# Patient Record
Sex: Male | Born: 1995 | Race: White | Hispanic: No | Marital: Single | Smoking: Never smoker
Health system: Southern US, Community
[De-identification: ages and names within clinical notes are randomized; demographics above are authoritative.]

## PROBLEM LIST (undated history)

## (undated) HISTORY — PX: TONSILECTOMY, ADENOIDECTOMY, BILATERAL MYRINGOTOMY AND TUBES: SHX2538

---
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
## 2014-02-04 ENCOUNTER — Ambulatory Visit (INDEPENDENT_AMBULATORY_CARE_PROVIDER_SITE_OTHER): Payer: BC Managed Care – PPO | Admitting: Family Medicine

## 2014-02-04 VITALS — BP 120/68 | HR 81 | Temp 98.2°F | Resp 16 | Ht 73.5 in | Wt 218.0 lb

## 2014-02-04 DIAGNOSIS — M791 Myalgia, unspecified site: Secondary | ICD-10-CM

## 2014-02-04 DIAGNOSIS — J029 Acute pharyngitis, unspecified: Secondary | ICD-10-CM

## 2014-02-04 DIAGNOSIS — IMO0001 Reserved for inherently not codable concepts without codable children: Secondary | ICD-10-CM

## 2014-02-04 DIAGNOSIS — D72829 Elevated white blood cell count, unspecified: Secondary | ICD-10-CM

## 2014-02-04 DIAGNOSIS — R509 Fever, unspecified: Secondary | ICD-10-CM

## 2014-02-04 LAB — POCT CBC
GRANULOCYTE PERCENT: 82 % — AB (ref 37–80)
HEMATOCRIT: 46.1 % (ref 43.5–53.7)
HEMOGLOBIN: 15.4 g/dL (ref 14.1–18.1)
LYMPH, POC: 1.3 (ref 0.6–3.4)
MCH, POC: 29.6 pg (ref 27–31.2)
MCHC: 33.4 g/dL (ref 31.8–35.4)
MCV: 88.5 fL (ref 80–97)
MID (cbc): 0.7 (ref 0–0.9)
MPV: 12.7 fL (ref 0–99.8)
PLATELET COUNT, POC: 148 10*3/uL (ref 142–424)
POC GRANULOCYTE: 8.9 — AB (ref 2–6.9)
POC LYMPH %: 11.7 % (ref 10–50)
POC MID %: 6.3 %M (ref 0–12)
RBC: 5.21 M/uL (ref 4.69–6.13)
RDW, POC: 14.1 %
WBC: 10.9 10*3/uL — AB (ref 4.6–10.2)

## 2014-02-04 LAB — POCT RAPID STREP A (OFFICE): RAPID STREP A SCREEN: NEGATIVE

## 2014-02-04 LAB — POCT INFLUENZA A/B
INFLUENZA B, POC: NEGATIVE
Influenza A, POC: NEGATIVE

## 2014-02-04 MED ORDER — AMOXICILLIN 875 MG PO TABS: 875.0000 mg | ORAL_TABLET | Freq: Two times a day (BID) | ORAL | Status: DC

## 2014-02-04 NOTE — Progress Notes (Signed)
Subjective:    Patient ID: Richard Forbes, male    DOB: October 30, 1996, 18 y.o.   MRN: [id number] No primary provider on file. This chart was scribed for Meredith StaggersJeffrey Kinnley Paulson, MD by Marica OtterNusrat Rahman, ED Scribe at [facility]. This patient was seen in room Room 11 and the patient's care was started at 10:13 AM.  Sore Throat  Associated symptoms include headaches (mild headaches ). Pertinent negatives include no coughing.   HPI Comments: Richard Forbes is a 18 y.o. male who presents to the [facility] complaining of intermittent fever of 102.7 degrees onset yesterday morning.  Pt complains of associated body aches and sore throat. Pt states that he took an Advil without relief. Pt denies coughing; and reports infrequent, mild headache. Pt did not check his temperature today. Pt further reports that [facility] [employer] [facility] for nasal and chest congestion and cough  Pt reports he was prescribed antibiotics by the infirmary - took for about a week, finished about 6 days ago- finished early,  when he began to feel better. Pt denies having a flu shot this year. Pt states that he had problems with asthma in the past but denies any issues with it "in a very long time." no abd pain, no dysuria.   Has been having some back pain - off and on for a few months. Middle of lower back.  No urinary sx's.   Review of Systems  Constitutional: Positive for fever.  HENT: Positive for sore throat.   Eyes: Negative for photophobia.  Respiratory: Negative for cough.        Chest congestion   Genitourinary: Negative for dysuria, urgency, frequency, hematuria, decreased urine volume and difficulty urinating.  Musculoskeletal: Positive for back pain.  Skin: Negative for rash.  Neurological: Positive for headaches (mild headaches ).       Objective:   Physical Exam  Nursing note and vitals reviewed. Constitutional: He is oriented to person, place, and time.  He appears well-developed and well-nourished. No distress.  HENT:  Head: Normocephalic and atraumatic.  No neck pain. Mild, infrequent headaches.  Scarring of the leftTM, no retraction and no fluid. Scarring of the right TM, no scarring, no retraction.  Minimal erythema and no exudate on the oropharynx.     Eyes: EOM are normal.  No photophobia   Neck: Neck supple. No tracheal deviation present.  Cardiovascular: Normal rate.   Pulmonary/Chest: Effort normal. No respiratory distress.  Abdominal: Normal appearance and bowel sounds are normal. There is no hepatosplenomegaly. There is no tenderness. There is no CVA tenderness.  Musculoskeletal: Normal range of motion.  Neurological: He is alert and oriented to person, place, and time.  Skin: Skin is warm and dry.  Psychiatric: He has a normal mood and affect. His behavior is normal.   Filed Vitals:   02/04/14 0919  BP: 120/68  Pulse: 81  Temp: 98.2 F (36.8 C)  TempSrc: Oral  Resp: 16  Height: 6' 1.5" (1.867 m)  Weight: 218 lb (98.884 kg)  SpO2: 98%     Results for orders placed in visit on 02/04/14  POCT INFLUENZA A/B      Result Value Ref Range   Influenza A, POC Negative     Influenza B, POC Negative    POCT RAPID STREP A (OFFICE)      Result Value Ref Range   Rapid Strep A Screen Negative  Negative  POCT  CBC      Result Value Ref Range   WBC 10.9 (*) 4.6 - 10.2 K/uL   Lymph, poc 1.3  0.6 - 3.4   POC LYMPH PERCENT 11.7  10 - 50 %L   MID (cbc) 0.7  0 - 0.9   POC MID % 6.3  0 - 12 %M   POC Granulocyte 8.9 (*) 2 - 6.9   Granulocyte percent 82.0 (*) 37 - 80 %G   RBC 5.21  4.69 - 6.13 M/uL   Hemoglobin 15.4  14.1 - 18.1 g/dL   HCT, POC 16.1  09.6 - 53.7 %   MCV 88.5  80 - 97 fL   MCH, POC 29.6  27 - 31.2 pg   MCHC 33.4  31.8 - 35.4 g/dL   RDW, POC 04.5     Platelet Count, POC 148  142 - 424 K/uL   MPV 12.7  0 - 99.8 fL     Assessment & Plan:   Richard Forbes is a 18 y.o. male Sore throat - Plan: POCT  Influenza A/B, POCT rapid strep A, Culture, Group A Strep, POCT CBC, Epstein-Barr virus VCA, IgM, amoxicillin (AMOXIL) 875 MG tablet  Fever - Plan: POCT Influenza A/B, POCT rapid strep A, POCT CBC  Myalgia - Plan: POCT Influenza A/B, POCT rapid strep A  Leukocytosis, unspecified - Plan: amoxicillin (AMOXIL) 875 MG tablet     Borderline CBC with fever last 2 nights. Sore throat. Throat cx sent. Will check EBV IGM now, but may need repeated in 2 weeks if negative. Contact sport precautions until then as spleen precautions discussed. Possible viral illness, with borderline CBC, but with granulocyte predominance - possible false negative strep. Start amoxicillin twice per day. Sx care, rtc precautions.     Meds ordered this encounter  Medications  . Ibuprofen (ADVIL) 200 MG CAPS    Sig: Take by mouth.  Marland Kitchen acetaminophen (TYLENOL) 500 MG tablet    Sig: Take 500 mg by mouth every 6 (six) hours as needed.  Marland Kitchen amoxicillin (AMOXIL) 875 MG tablet    Sig: Take 1 tablet (875 mg total) by mouth 2 (two) times daily.    Dispense:  20 tablet    Refill:  0   Patient Instructions  You should receive a call or letter about your lab results within the next week to 10 days.  Start amoxicillin for possible strep throat, but avoid contact sports until mono testing returns - this may need to be rechecked in next week or two.  Ibuprofen if needed for fever/sore throat.  Return to the clinic or go to the nearest emergency room if any of your symptoms worsen or new symptoms occur.   Sore Throat A sore throat is pain, burning, irritation, or scratchiness of the throat. There is often pain or tenderness when swallowing or talking. A sore throat may be accompanied by other symptoms, such as coughing, sneezing, fever, and swollen neck glands. A sore throat is often the first sign of another sickness, such as a cold, flu, strep throat, or mononucleosis (commonly known as mono). Most sore throats go away without  medical treatment. CAUSES  The most common causes of a sore throat include:  A viral infection, such as a cold, flu, or mono.  A bacterial infection, such as strep throat, tonsillitis, or whooping cough.  Seasonal allergies.  Dryness in the air.  Irritants, such as smoke or pollution.  Gastroesophageal reflux disease (GERD). HOME CARE INSTRUCTIONS   Only  take over-the-counter medicines as directed by your caregiver.  Drink enough fluids to keep your urine clear or pale yellow.  Rest as needed.  Try using throat sprays, lozenges, or sucking on hard candy to ease any pain (if older than 4 years or as directed).  Sip warm liquids, such as broth, herbal tea, or warm water with honey to relieve pain temporarily. You may also eat or drink cold or frozen liquids such as frozen ice pops.  Gargle with salt water (mix 1 tsp salt with 8 oz of water).  Do not smoke and avoid secondhand smoke.  Put a cool-mist humidifier in your bedroom at night to moisten the air. You can also turn on a hot shower and sit in the bathroom with the door closed for 5 10 minutes. SEEK IMMEDIATE MEDICAL CARE IF:  You have difficulty breathing.  You are unable to swallow fluids, soft foods, or your saliva.  You have increased swelling in the throat.  Your sore throat does not get better in 7 days.  You have nausea and vomiting.  You have a fever or persistent symptoms for more than 2 3 days.  You have a fever and your symptoms suddenly get worse. MAKE SURE YOU:   Understand these instructions.  Will watch your condition.  Will get help right away if you are not doing well or get worse. Document Released: 12/24/2004 Document Revised: 11/02/2012 Document Reviewed: 07/24/2012 [facility] Patient Information 2014 [place], [place].        I personally performed the services described in this documentation, which was scribed in my presence. The recorded information has been reviewed and considered,  and addended by me as needed.

## 2014-02-04 NOTE — Patient Instructions (Signed)
You should receive a call or letter about your lab results within the next week to 10 days.  Start amoxicillin for possible strep throat, but avoid contact sports until mono testing returns - this may need to be rechecked in next week or two.  Ibuprofen if needed for fever/sore throat.  Return to the clinic or go to the nearest emergency room if any of your symptoms worsen or new symptoms occur.   Sore Throat A sore throat is pain, burning, irritation, or scratchiness of the throat. There is often pain or tenderness when swallowing or talking. A sore throat may be accompanied by other symptoms, such as coughing, sneezing, fever, and swollen neck glands. A sore throat is often the first sign of another sickness, such as a cold, flu, strep throat, or mononucleosis (commonly known as mono). Most sore throats go away without medical treatment. CAUSES  The most common causes of a sore throat include:  A viral infection, such as a cold, flu, or mono.  A bacterial infection, such as strep throat, tonsillitis, or whooping cough.  Seasonal allergies.  Dryness in the air.  Irritants, such as smoke or pollution.  Gastroesophageal reflux disease (GERD). HOME CARE INSTRUCTIONS   Only take over-the-counter medicines as directed by your caregiver.  Drink enough fluids to keep your urine clear or pale yellow.  Rest as needed.  Try using throat sprays, lozenges, or sucking on hard candy to ease any pain (if older than 4 years or as directed).  Sip warm liquids, such as broth, herbal tea, or warm water with honey to relieve pain temporarily. You may also eat or drink cold or frozen liquids such as frozen ice pops.  Gargle with salt water (mix 1 tsp salt with 8 oz of water).  Do not smoke and avoid secondhand smoke.  Put a cool-mist humidifier in your bedroom at night to moisten the air. You can also turn on a hot shower and sit in the bathroom with the door closed for 5 10 minutes. SEEK  IMMEDIATE MEDICAL CARE IF:  You have difficulty breathing.  You are unable to swallow fluids, soft foods, or your saliva.  You have increased swelling in the throat.  Your sore throat does not get better in 7 days.  You have nausea and vomiting.  You have a fever or persistent symptoms for more than 2 3 days.  You have a fever and your symptoms suddenly get worse. MAKE SURE YOU:   Understand these instructions.  Will watch your condition.  Will get help right away if you are not doing well or get worse. Document Released: 12/24/2004 Document Revised: 11/02/2012 Document Reviewed: 07/24/2012 [facility] Patient Information 2014 [place], [place].

## 2014-02-06 LAB — EPSTEIN-BARR VIRUS VCA, IGM: EBV VCA IgM: <10 U/mL (ref <36.0)

## 2014-02-07 LAB — CULTURE, GROUP A STREP

## 2014-05-16 ENCOUNTER — Ambulatory Visit (INDEPENDENT_AMBULATORY_CARE_PROVIDER_SITE_OTHER): Payer: BC Managed Care – PPO | Admitting: Family Medicine

## 2014-05-16 VITALS — BP 102/64 | HR 74 | Temp 98.3°F | Resp 18 | Ht 73.5 in | Wt 214.0 lb

## 2014-05-16 DIAGNOSIS — H66019 Acute suppurative otitis media with spontaneous rupture of ear drum, unspecified ear: Secondary | ICD-10-CM

## 2014-05-16 MED ORDER — OFLOXACIN 0.3 % OT SOLN
10.0000 [drp] | Freq: Every day | OTIC | Status: AC
Start: 2014-05-16 — End: ?

## 2014-05-16 NOTE — Patient Instructions (Signed)
Use drops as prescribed for 7 days Follow up with ENT Keep water out of ear- can use a cotton ball  Otitis Media Otitis media is redness, soreness, and swelling (inflammation) of the middle ear. Otitis media may be caused by allergies or, most commonly, by infection. Often it occurs as a complication of the common cold. SIGNS AND SYMPTOMS Symptoms of otitis media may include:  Earache.  Fever.  Ringing in your ear.  Headache.  Leakage of fluid from the ear. DIAGNOSIS To diagnose otitis media, your health care provider will examine your ear with an otoscope. This is an instrument that allows your health care provider to see into your ear in order to examine your eardrum. Your health care provider also will ask you questions about your symptoms. TREATMENT  Typically, otitis media resolves on its own within 3-5 days. Your health care provider may prescribe medicine to ease your symptoms of pain. If otitis media does not resolve within 5 days or is recurrent, your health care provider may prescribe antibiotic medicines if he or she suspects that a bacterial infection is the cause. HOME CARE INSTRUCTIONS   Take your medicine as directed until it is gone, even if you feel better after the first few days.  Only take over-the-counter or prescription medicines for pain, discomfort, or fever as directed by your health care provider.  Follow up with your health care provider as directed. SEEK MEDICAL CARE IF:  You have otitis media only in one ear, or bleeding from your nose, or both.  You notice a lump on your neck.  You are not getting better in 3-5 days.  You feel worse instead of better. SEEK IMMEDIATE MEDICAL CARE IF:   You have pain that is not controlled with medicine.  You have swelling, redness, or pain around your ear or stiffness in your neck.  You notice that part of your face is paralyzed.  You notice that the bone behind your ear (mastoid) is tender when you touch  it. MAKE SURE YOU:   Understand these instructions.  Will watch your condition.  Will get help right away if you are not doing well or get worse. Document Released: 08/21/2004 Document Revised: 11/21/2013 Document Reviewed: 06/13/2013 [facility] Patient Information 2015 [place], [place]. This information is not intended to replace advice given to you by your health care provider. Make sure you discuss any questions you have with your health care provider.

## 2014-05-16 NOTE — Progress Notes (Signed)
   Subjective:    Patient ID: Richard Forbes, male    DOB: 12/24/1995, 18 y.o.   MRN: [id number]  HPI This is a very pleasant 18 yo male who is accompanied by his mother. The patient presents today with complaints of left ear pain- he had sharp pain throughout the day yesterday with a feeling of popping and draining last night. Pain was greatly improved after draining. Took Alleve 2 tablets this morning which helped with pain. Has had feeling of left ear being stopped up off and on for several months. His mother reports that he has always had problems with his ears, requiring tubes about 5 times. Normally sees Dr. Dorma RussellKraus ENT. Patient admits to using cotton swabs.  Patient just finished his [employer] year at [employer]. 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 He will be starting meloxicam today for an orthopedic problem. He knows not to take with Alleve.  Review of Systems Felt feverish this morning prior to taking Alleve, slight sore throat, no cough, 1 day nasal drainage.    Objective:   Physical Exam  Vitals reviewed. Constitutional: He appears well-developed and well-nourished.  HENT:  Head: Normocephalic and atraumatic.  Right Ear: Tympanic membrane, external ear and ear canal normal.  Left Ear: There is drainage. Tympanic membrane is perforated.  Nose: Nose normal.  Mouth/Throat: Oropharynx is clear and moist.  Left ear canal with small amount bloody drainage.  Cardiovascular: Normal rate, regular rhythm and normal heart sounds.   Pulmonary/Chest: Effort normal and breath sounds normal.      Assessment & Plan:  1. Otitis media, purulent, acute, with spontaneous rupture of TM - ofloxacin (FLOXIN) 0.3 % otic solution; Place 10 drops into the left ear daily.  Dispense: 5 mL; Refill: 0 -provided written and verbal instructions regarding diagnosis, medication use and instructed to keep water out of ear. -patient instructed to stop inserting cotton swabs  into his ears -Patient's mother will make and appointment with Dr. Dorma RussellKraus.  Emi Belfasteborah B. Gessner, FNP-BC  [facility], [facility]  05/16/2014 10:20 AM
